# Patient Record
Sex: Female | Born: 1975 | Race: White | Hispanic: Yes | Marital: Married | Smoking: Never smoker
Health system: Southern US, Community
[De-identification: ages and names within clinical notes are randomized; demographics above are authoritative.]

---

## 2001-08-09 ENCOUNTER — Encounter: Payer: Self-pay | Admitting: Emergency Medicine

## 2001-08-09 ENCOUNTER — Emergency Department (HOSPITAL_COMMUNITY): Admission: EM | Admit: 2001-08-09 | Discharge: 2001-08-09 | Payer: Self-pay | Admitting: Emergency Medicine

## 2004-03-29 ENCOUNTER — Ambulatory Visit: Payer: Self-pay | Admitting: Obstetrics and Gynecology

## 2004-06-07 ENCOUNTER — Encounter (INDEPENDENT_AMBULATORY_CARE_PROVIDER_SITE_OTHER): Payer: Self-pay | Admitting: Specialist

## 2004-06-07 ENCOUNTER — Other Ambulatory Visit: Admission: RE | Admit: 2004-06-07 | Discharge: 2004-06-07 | Payer: Self-pay | Admitting: Obstetrics & Gynecology

## 2004-06-07 ENCOUNTER — Ambulatory Visit: Payer: Self-pay | Admitting: Family Medicine

## 2004-07-05 ENCOUNTER — Ambulatory Visit: Payer: Self-pay | Admitting: Obstetrics & Gynecology

## 2006-10-26 ENCOUNTER — Ambulatory Visit (HOSPITAL_COMMUNITY): Admission: RE | Admit: 2006-10-26 | Discharge: 2006-10-26 | Payer: Self-pay | Admitting: Family Medicine

## 2006-11-19 ENCOUNTER — Encounter: Payer: Self-pay | Admitting: Family Medicine

## 2006-11-19 ENCOUNTER — Ambulatory Visit (HOSPITAL_COMMUNITY): Admission: RE | Admit: 2006-11-19 | Discharge: 2006-11-19 | Payer: Self-pay | Admitting: Family Medicine

## 2006-11-19 ENCOUNTER — Ambulatory Visit: Payer: Self-pay | Admitting: Vascular Surgery

## 2007-01-04 ENCOUNTER — Ambulatory Visit (HOSPITAL_COMMUNITY): Admission: RE | Admit: 2007-01-04 | Discharge: 2007-01-04 | Payer: Self-pay | Admitting: Obstetrics & Gynecology

## 2007-02-18 ENCOUNTER — Inpatient Hospital Stay (HOSPITAL_COMMUNITY): Admission: AD | Admit: 2007-02-18 | Discharge: 2007-02-19 | Payer: Self-pay | Admitting: Obstetrics and Gynecology

## 2007-02-18 ENCOUNTER — Ambulatory Visit: Payer: Self-pay | Admitting: Physician Assistant

## 2009-06-03 ENCOUNTER — Ambulatory Visit (HOSPITAL_COMMUNITY): Admission: RE | Admit: 2009-06-03 | Discharge: 2009-06-03 | Payer: Self-pay | Admitting: Family Medicine

## 2010-10-07 NOTE — Group Therapy Note (Signed)
Jillian Mcdonald, SACHS NO.:  [id number]   MEDICAL RECORD NO.:  [id number]          PATIENT TYPE:  WOC   LOCATION:  [facility]                   FACILITY:  [facility]   PHYSICIAN:  Jon Gills, MD    DATE OF BIRTH:  [birth year]   DATE OF SERVICE:  06/07/2004                                    CLINIC NOTE   CHIEF COMPLAINT:  Cervical dysphagia, followup for LEEP and IUD removal.   SUBJECTIVE:  Ms. Jillian Mcdonald is here today for her LEEP.  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 She is here for a  LEEP.  She also has an IUD in place which has been there for five years and  is ready for an new IUD.  She does desire to continue that form of birth  control.   She does desire fertility but has one child and does not want to be pregnant  now.   OBJECTIVE:  VITAL SIGNS:  As noted.  GENERAL:  She is a well-appearing, small, Hispanic female in no acute  distress.  PELVIC:  Pelvic exam reveals normal external female genitalia, normal  urethra, and vaginal wall.  Cervix appears multiparous.   PROCEDURES:  1.  IUD removal:  A ring forceps was used to remove the IUD without      complication.  There was minimal blood loss.  2.  The patient was consented for LEEP procedure.  She had already watched      the LEEP video and had no further questions.  Speculum was placed, and      Lugol solution was placed on the cervix which could not delineate the      transformation well due to the fact that there was some bleeding from      the IUD removal.  __________ was used for a cervical block at 2 o'clock,      4 o'clock, 8 o'clock, and 10 o'clock with approximately 1.5 mL in each      spot.  A Fisher LEEP was used to remove the portion of the endocervix      and endocervix that was affected with CIN.  The Bovie was then used with      the ball to electrocauterize the border between the cervix and the      portion that was removed.  There was minimal blood  loss, and Monsel      solution was used for hemostasis.  The patient tolerated the procedure      well without complications.  The biopsy was sent to pathology.   ASSESSMENT AND PLAN:  1.  Cervical dysplasia.  She is now status post loop electrosurgical      excision procedure.  We will have her follow up for the results.  She      should return with any bleeding, severe cramping, or fevers.  2.  Contraception.  We will go ahead and replace an IUD in four weeks once      the [facility] funding is in place.  At that time, we can follow  up her LEEP      result.     LC/MEDQ  D:  06/07/2004  T:  06/07/2004  Job:  [id number]

## 2010-10-07 NOTE — Group Therapy Note (Signed)
Jillian Mcdonald, ALIA NO.:  [id number]   MEDICAL RECORD NO.:  [id number]          PATIENT TYPE:  WOC   LOCATION:  [facility]                   FACILITY:  [facility]   PHYSICIAN:  Argentina Donovan, MD        DATE OF BIRTH:  05/18/1976   DATE OF SERVICE:  03/29/2004                                    CLINIC NOTE   This patient was referred from [facility] for a LEEP.  She had a recent  colposcopy that showed a CIN I exocervix dysplasia and an endocervix with  detached dysplastic squamous cells, probably CIN I or II.  Patient has an  IUD which has been in for five years.  She has a 35-year-old child, her only  pregnancy.  She is a nonsmoker.  We have discussed with the patient and she  has watched the LEEP film.  We have talked about the possible complications  involved and long-term follow-up.  We have also told the patient the day of  the LEEP we will remove the IUD, we will do the LEEP and then several weeks  later we can reinsert a LEEP for her.   DIAGNOSES:  CIN I with endocervical involvement, perhaps CIN II.      PR/MEDQ  D:  03/29/2004  T:  03/30/2004  Job:  [id number]

## 2010-10-07 NOTE — Group Therapy Note (Signed)
NAMEKIMRA, KANTOR NO.:  [id number]   MEDICAL RECORD NO.:  [id number]          PATIENT TYPE:  WOC   LOCATION:  [facility]                   [facility]ACILITY:  [facility]   PHYSICIAN:  Elsie Lincoln, MD      DATE OF BIRTH:  [birth year]   DATE OF SERVICE:  07/05/2004                                    CLINIC NOTE   REASON FOR VISIT:  The patient is a 35 year old female status post LEEP and  IUD removal. 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 Her previous biopsy had shown  possible CIN-2, which is why we did the LEEP. Today the patient has no  complaints of bleeding or abdominal pain. She is waiting for Mirena funding  to have an IUD placed. In the meantime, she will start Depo.   PHYSICAL EXAMINATION:  Cervix healed well, no bleeding, no discharge.   ASSESSMENT AND PLAN:  Status post loop electrocautery excision procedure,  for Depo-Provera.   1.  Can return to sexual activity.  2.  When Mirena funding is in place we will have her come back for IUD      placement.  3.  GC/chlamydia cultures done today.      KL/MEDQ  D:  07/05/2004  T:  07/06/2004  Job:  [id number]

## 2011-03-02 LAB — CBC
MCHC: 35.2
MCV: 90.2
Platelets: 154
Platelets: 212
RDW: 13.2
WBC: 12.3 — ABNORMAL HIGH

## 2011-03-02 LAB — RPR: RPR Ser Ql: NONREACTIVE

## 2019-01-03 ENCOUNTER — Other Ambulatory Visit: Payer: Self-pay | Admitting: Obstetrics and Gynecology

## 2019-01-03 DIAGNOSIS — Z1231 Encounter for screening mammogram for malignant neoplasm of breast: Secondary | ICD-10-CM

## 2019-01-20 ENCOUNTER — Other Ambulatory Visit: Payer: Self-pay

## 2019-01-20 ENCOUNTER — Ambulatory Visit
Admission: RE | Admit: 2019-01-20 | Discharge: 2019-01-20 | Disposition: A | Discharge status: Home / Self Care | Payer: No Typology Code available for payment source | Source: Ambulatory Visit | Attending: Obstetrics and Gynecology | Admitting: Obstetrics and Gynecology

## 2019-01-20 DIAGNOSIS — Z1231 Encounter for screening mammogram for malignant neoplasm of breast: Secondary | ICD-10-CM

## 2019-01-22 ENCOUNTER — Other Ambulatory Visit: Payer: Self-pay | Admitting: Obstetrics and Gynecology

## 2019-01-22 DIAGNOSIS — R928 Other abnormal and inconclusive findings on diagnostic imaging of breast: Secondary | ICD-10-CM

## 2019-01-29 ENCOUNTER — Ambulatory Visit
Admission: RE | Admit: 2019-01-29 | Discharge: 2019-01-29 | Disposition: A | Discharge status: Home / Self Care | Payer: Self-pay | Source: Ambulatory Visit | Attending: Obstetrics and Gynecology | Admitting: Obstetrics and Gynecology

## 2019-01-29 ENCOUNTER — Other Ambulatory Visit: Payer: Self-pay

## 2019-01-29 DIAGNOSIS — R928 Other abnormal and inconclusive findings on diagnostic imaging of breast: Secondary | ICD-10-CM

## 2019-02-03 ENCOUNTER — Other Ambulatory Visit: Payer: No Typology Code available for payment source

## 2021-05-04 ENCOUNTER — Other Ambulatory Visit: Payer: Self-pay

## 2021-05-04 DIAGNOSIS — Z1231 Encounter for screening mammogram for malignant neoplasm of breast: Secondary | ICD-10-CM

## 2021-06-09 ENCOUNTER — Ambulatory Visit
Admission: RE | Admit: 2021-06-09 | Discharge: 2021-06-09 | Disposition: A | Discharge status: Home / Self Care | Payer: No Typology Code available for payment source | Source: Ambulatory Visit | Attending: Obstetrics & Gynecology | Admitting: Obstetrics & Gynecology

## 2021-06-09 DIAGNOSIS — Z1231 Encounter for screening mammogram for malignant neoplasm of breast: Secondary | ICD-10-CM

## 2021-07-06 ENCOUNTER — Other Ambulatory Visit: Payer: No Typology Code available for payment source

## 2022-05-18 ENCOUNTER — Other Ambulatory Visit: Payer: Self-pay

## 2022-05-18 ENCOUNTER — Telehealth: Payer: Self-pay

## 2022-05-18 DIAGNOSIS — N644 Mastodynia: Secondary | ICD-10-CM

## 2022-05-18 NOTE — Telephone Encounter (Signed)
Telephoned patient with interpreter(Claudia Gaytan), left a voice message from [employer] (scholarship).

## 2022-07-06 ENCOUNTER — Ambulatory Visit: Payer: No Typology Code available for payment source

## 2022-07-06 ENCOUNTER — Ambulatory Visit: Payer: Self-pay | Admitting: Hematology and Oncology

## 2022-07-06 ENCOUNTER — Ambulatory Visit
Admission: RE | Admit: 2022-07-06 | Discharge: 2022-07-06 | Disposition: A | Discharge status: Home / Self Care | Payer: No Typology Code available for payment source | Source: Ambulatory Visit | Attending: Obstetrics and Gynecology | Admitting: Obstetrics and Gynecology

## 2022-07-06 VITALS — BP 136/82 | Ht 59.0 in | Wt 109.0 lb

## 2022-07-06 DIAGNOSIS — N644 Mastodynia: Secondary | ICD-10-CM

## 2022-07-06 NOTE — Progress Notes (Signed)
Ms. Jillian Mcdonald is a 46 y.o. female who presents to [facility] clinic today with complaint of bilateral nipple pain.    Pap Smear: Pap not smear completed today. Last Pap smear was 2023 at [facility] clinic and was normal. Per patient has no history of an abnormal Pap smear. Last Pap smear result is not available in Epic.   Physical exam: Breasts Breasts symmetrical. No skin abnormalities bilateral breasts. No nipple retraction bilateral breasts. No nipple discharge bilateral breasts. Right lymphadenopathy noted. No lumps palpated bilateral breasts.       Pelvic/Bimanual Pap is not indicated today    Smoking History: Patient has never smoked and was not referred to quit line.    Patient Navigation: Patient education provided. Access to services provided for patient through [employer] interpreter provided. No transportation provided   Colorectal Cancer Screening: Per patient has never had colonoscopy completed No complaints today.    Breast and Cervical Cancer Risk Assessment: Patient does not have family history of breast cancer, known genetic mutations, or radiation treatment to the chest before age 80. Patient does not have history of cervical dysplasia, immunocompromised, or DES exposure in-utero.  Risk Scores as of 07/06/2022     Jillian Mcdonald           5-year 0.6 %   Lifetime 7.74 %   This patient is [place]/Latina but has no documented birth country, so the [place] used data from [facility] patients to calculate their risk score. Document a birth country in the Demographics activity for a more accurate score.         Last calculated by Claretha Cooper, CMA on 07/06/2022 at 12:47 PM        A: [facility] exam without pap smear Complaint of bilateral nipple pain with right lymphadenopathy.  P: Referred patient to the [facility] for a diagnostic mammogram. Appointment scheduled 07/06/22.  Melodye Ped, NP 07/06/2022 12:59 PM

## 2022-07-06 NOTE — Patient Instructions (Signed)
Taught Jillian Mcdonald about breast self awareness and gave educational materials to take home. Patient did not need a Pap smear today due to last Pap smear was in 2023 per patient.  Let her know [facility] will cover Pap smears every 5 years unless has a history of abnormal Pap smears. Referred patient to the [facility] for diagnostic mammogram. Appointment scheduled for 07/06/22. Patient aware of appointment and will be there. Let patient know will follow up with her within the next couple weeks with results. [name] verbalized understanding.  Melodye Ped, NP 1:01 PM

## 2023-12-06 IMAGING — MG MM DIGITAL SCREENING BILAT W/ TOMO AND CAD
8 series · 9 of 24 positions shown · non-contrast
Comparison: Previous exam(s).

CLINICAL DATA: Screening.

EXAM:
DIGITAL SCREENING BILATERAL MAMMOGRAM WITH TOMOSYNTHESIS AND CAD
TECHNIQUE: Bilateral screening digital craniocaudal and mediolateral oblique
mammograms were obtained. Bilateral screening digital breast
tomosynthesis was performed. The images were evaluated with
computer-aided detection.

[L MLO synth-2D]
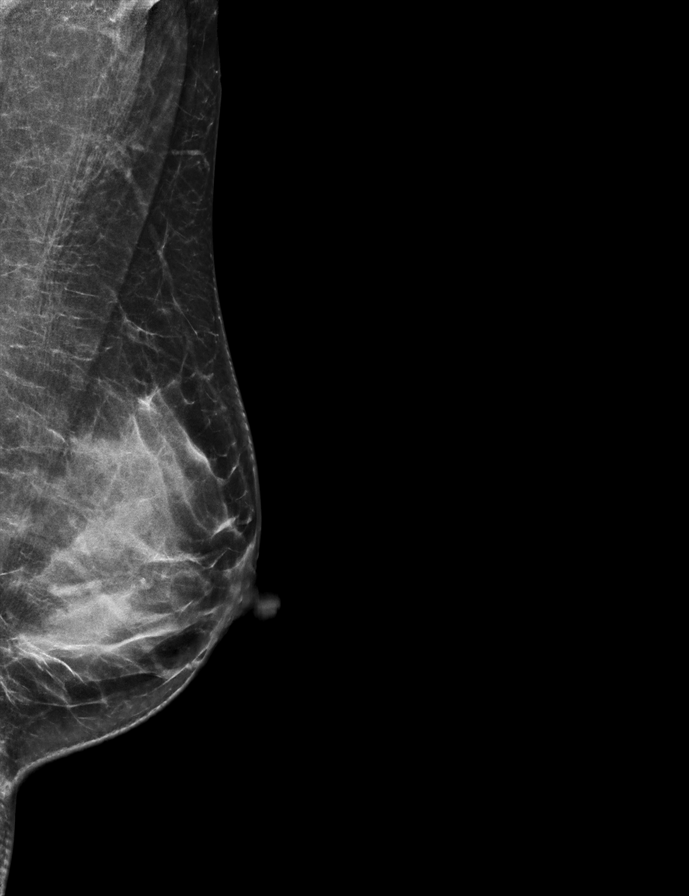

[R MLO synth-2D]
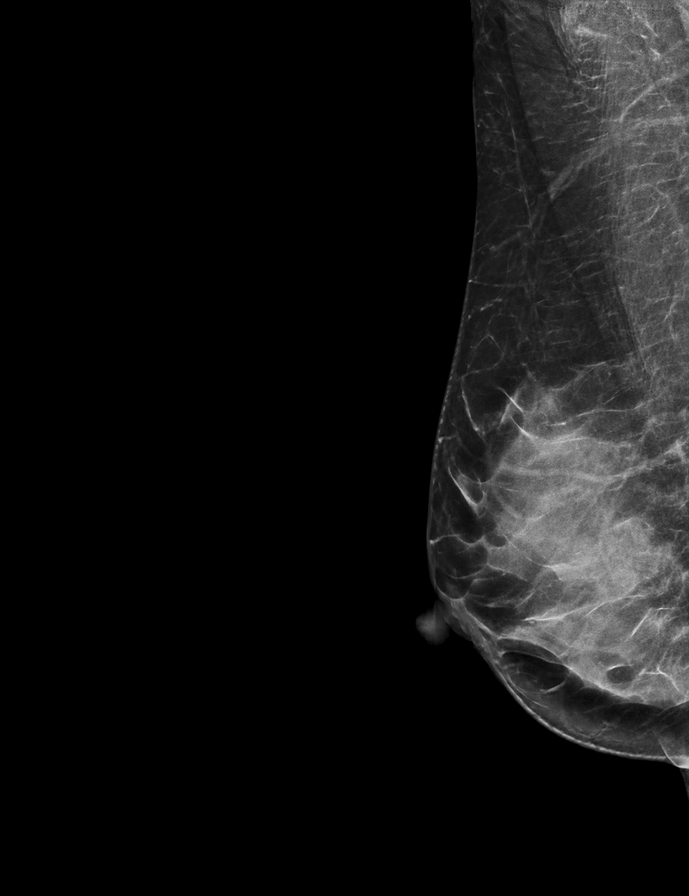

[R CC synth-2D]
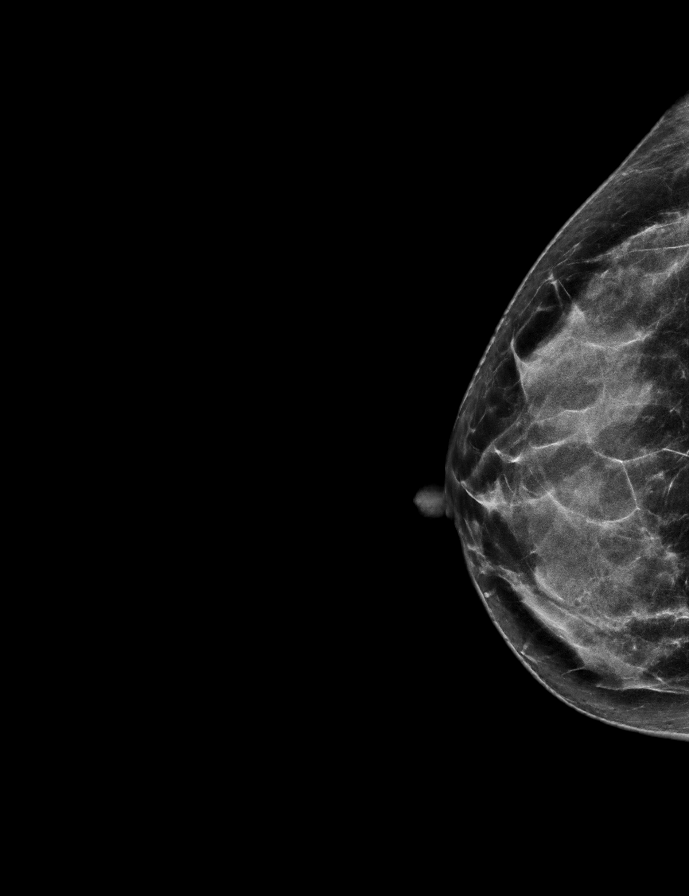

[L CC synth-2D]
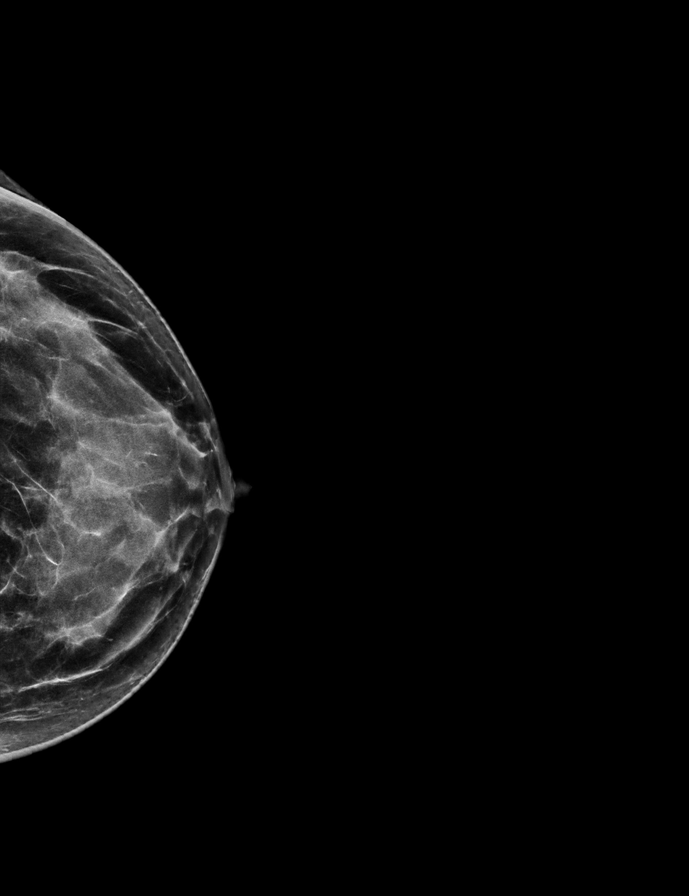

[L CC tomo · 2 of 54 frames shown]
[frame 18/54]
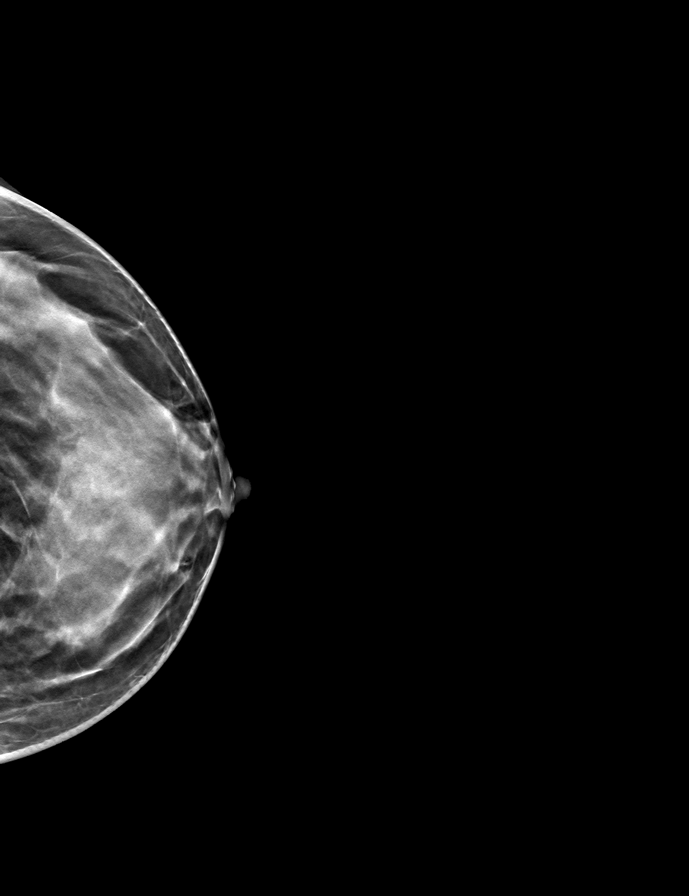
[frame 27/54]
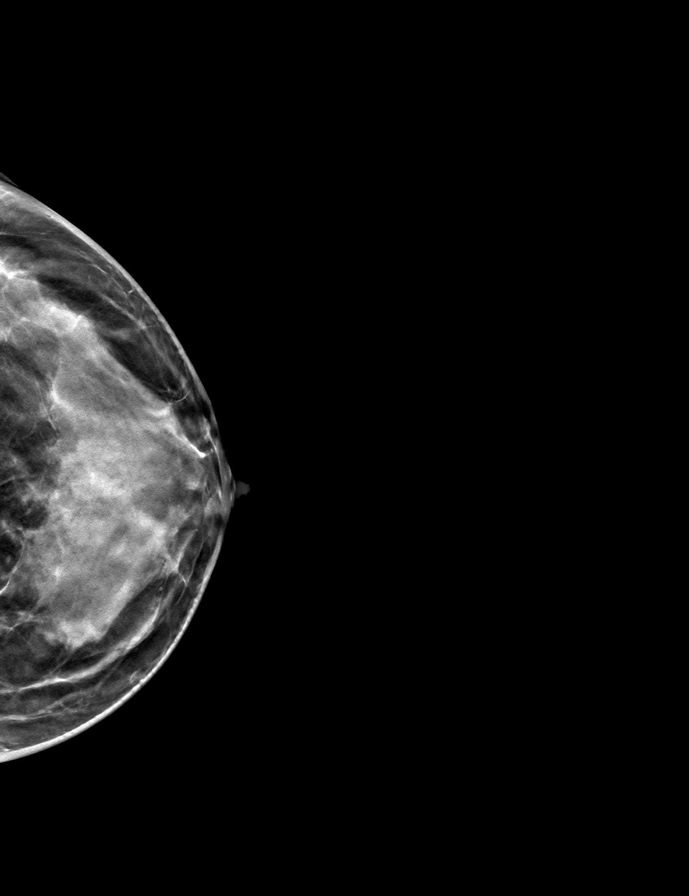

[L MLO tomo · tomo slice 31/60.0]
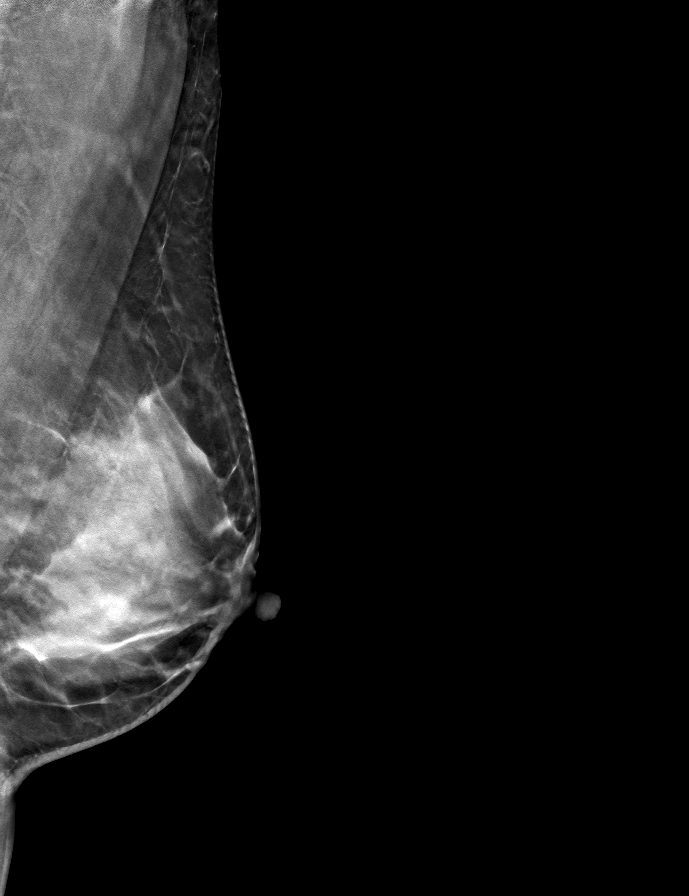

[R MLO tomo · tomo slice 32/63.0]
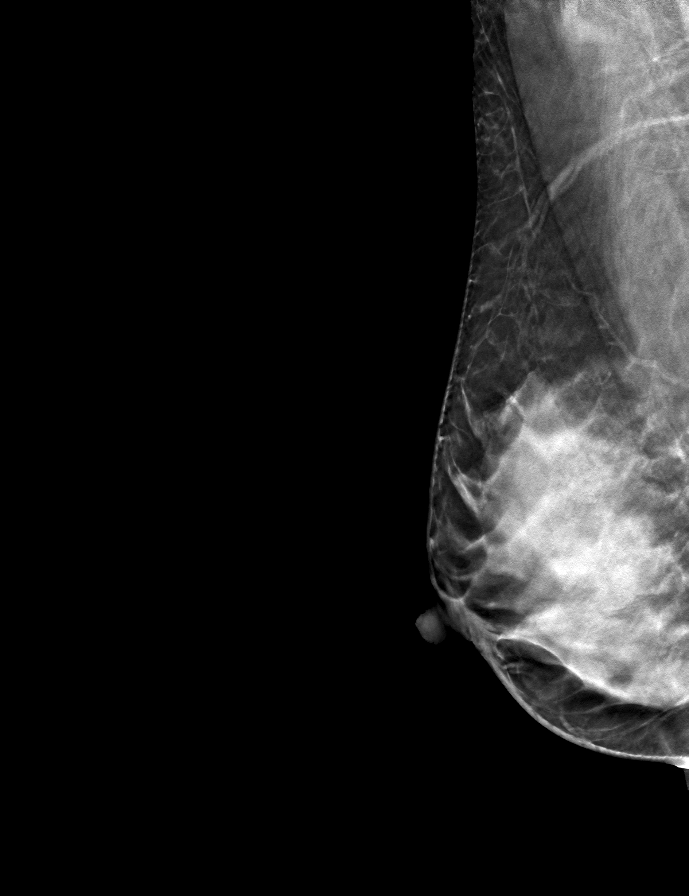

[R CC tomo · tomo slice 28/55.0]
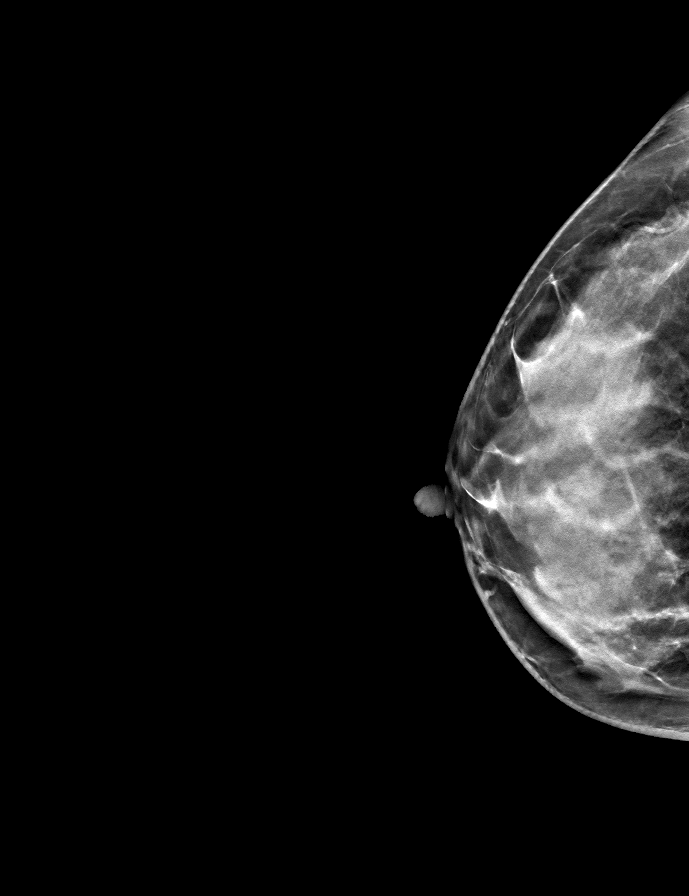

[9 of 24 positions shown; findings below may reference images not displayed]

ACR Breast Density Category d: The breast tissue is extremely dense,
which lowers the sensitivity of mammography
FINDINGS: There are no findings suspicious for malignancy.
IMPRESSION: No mammographic evidence of malignancy. A result letter of this
screening mammogram will be mailed directly to the patient.

RECOMMENDATION:
Screening mammogram in one year. (Code:TA-V-WV9)

BI-RADS CATEGORY  1: Negative.
